# Patient Record
Sex: Male | Born: 1975 | Race: Black or African American | Hispanic: No | Marital: Single | Smoking: Never smoker
Health system: Southern US, Community
[De-identification: ages and names within clinical notes are randomized; demographics above are authoritative.]

## PROBLEM LIST (undated history)

## (undated) DIAGNOSIS — T7840XA Allergy, unspecified, initial encounter: Secondary | ICD-10-CM

## (undated) HISTORY — PX: TOTAL HIP ARTHROPLASTY: SHX124

## (undated) HISTORY — DX: Allergy, unspecified, initial encounter: T78.40XA

---

## 2014-06-12 ENCOUNTER — Telehealth: Payer: Self-pay | Admitting: Oncology

## 2014-06-12 NOTE — Telephone Encounter (Signed)
new patient appt-s/w patient and gave np appt for 06/02 @ 10:30 w/Dr. Clelia CroftShadad.  Referring Dr. Leodis SiasFrancis Wong  Dx-Lymphoadenpathy

## 2014-06-28 ENCOUNTER — Telehealth: Payer: Self-pay | Admitting: *Deleted

## 2014-06-28 NOTE — Telephone Encounter (Signed)
This RN called and left a message on patient's cell phone regarding date and time of his appointments on June, 2nd. Instructed to call [phone] if he had any questions.

## 2014-06-29 ENCOUNTER — Ambulatory Visit: Payer: Managed Care, Other (non HMO)

## 2014-06-29 ENCOUNTER — Encounter (INDEPENDENT_AMBULATORY_CARE_PROVIDER_SITE_OTHER): Payer: Self-pay

## 2014-06-29 ENCOUNTER — Telehealth: Payer: Self-pay | Admitting: Oncology

## 2014-06-29 ENCOUNTER — Ambulatory Visit (HOSPITAL_BASED_OUTPATIENT_CLINIC_OR_DEPARTMENT_OTHER): Payer: Managed Care, Other (non HMO) | Admitting: Oncology

## 2014-06-29 ENCOUNTER — Other Ambulatory Visit: Payer: Self-pay

## 2014-06-29 ENCOUNTER — Encounter: Payer: Self-pay | Admitting: Oncology

## 2014-06-29 VITALS — BP 125/80 | HR 68 | Temp 98.7°F | Resp 18 | Ht 70.0 in | Wt 218.8 lb

## 2014-06-29 DIAGNOSIS — H938X1 Other specified disorders of right ear: Secondary | ICD-10-CM

## 2014-06-29 NOTE — Progress Notes (Signed)
Please see consult note.  

## 2014-06-29 NOTE — Consult Note (Signed)
Reason for Referral: Periauricular mass.   HPI: [name] is a 39 year old gentleman without really any significant past medical history. He does have seasonal allergy but for the most part rather healthy. He did have traumatic injury and required surgery as a child but for the most part rather healthy and only takes allergy medication. 9 years ago he noted a lump on his right side of the neck that was lanced surgically and was told that it was an abscess. 6 months ago, he noted a small growth behind his right ear but has not changed over the period of time. He feels is similar in appearance to what he had 9 years ago. He has not reported any lymphadenopathy. Does not report any neck masses or any other lumps anywhere. He does not report any constitutional symptoms of fevers or chills or sweats. He does not report any change in his appetite or performance status. He does not report any pruritus or easy bruisability.  He does not report any headaches, blurry vision, syncope or seizures. He does not report any fevers, chills, sweats or weight loss. He does not report any chest pain, palpitation orthopnea. He does not report any cough or hemoptysis or hematemesis. Report any nausea or vomiting or abdominal pain. Cipro any hematochezia or melanoma. Remaining review of systems unremarkable.   Past Medical History  Diagnosis Date  . Allergy   :  No past surgical history on file.:   Current outpatient prescriptions:  .  loratadine (CLARITIN) 10 MG tablet, Take 10 mg by mouth daily., Disp: , Rfl: :  No Known Allergies:  No family history on file.:  History   Social History  . Marital Status: Single    Spouse Name: N/A  . Number of Children: N/A  . Years of Education: N/A   Occupational History  . Not on file.   Social History Main Topics  . Smoking status: Not on file  . Smokeless tobacco: Not on file  . Alcohol Use: Not on file  . Drug Use: Not on file  . Sexual Activity: Not on file    Other Topics Concern  . Not on file   Social History Narrative  . No narrative on file  :  Pertinent items are noted in HPI.  Exam: ECOG 0 Blood pressure 125/80, pulse 68, temperature 98.7 F (37.1 C), temperature source Oral, resp. rate 18, height 5\' 10"  (1.778 m), weight 218 lb 12.8 oz (99.247 kg). General appearance: alert and cooperative Head: Normocephalic, without obvious abnormality, atraumatic Ears: normal TM's and external ear canals both ears and A 10 mm nodule noted in the periauricular area on the right side. Throat: lips, mucosa, and tongue normal; teeth and gums normal Neck: no adenopathy and no carotid bruit Back: negative, symmetric, no curvature. ROM normal. No CVA tenderness. Resp: clear to auscultation bilaterally Chest wall: no tenderness Cardio: regular rate and rhythm, S1, S2 normal, no murmur, click, rub or gallop GI: soft, non-tender; bowel sounds normal; no masses,  no organomegaly Extremities: extremities normal, atraumatic, no cyanosis or edema Pulses: 2+ and symmetric Lymph nodes: Cervical, supraclavicular, and axillary nodes normal.   Assessment and Plan:   39 year old gentleman with right-sided periauricular growth measuring around 10 mm. Soft touch without any erythema or induration. I cannot feel any lymphadenopathy in the cervical area or any other area in his body. His laboratory testing for the last year was reviewed today and shows no abnormalities. An official diagnosis was discussed today with the  patient. This could be a benign growth versus an abscess and less likely malignancy. Lymphoma also is on the differential given his Declercq age and presentation.  I will refer him to [facility] for surgical removal of a growth and pathology review. I will defer any imaging studies unless the nature of his growth is malignant.  All his questions are answered today to his satisfaction.

## 2014-06-29 NOTE — Telephone Encounter (Signed)
Gave and prnted appt sched and avs fo rpt for June and July.....Dr. Pollyann Kennedyosen 6.3 @ 1pm

## 2014-06-29 NOTE — Progress Notes (Signed)
Checked in new pt with no financial concerns. °

## 2014-08-23 ENCOUNTER — Ambulatory Visit: Payer: Self-pay | Admitting: Oncology

## 2015-12-06 ENCOUNTER — Telehealth: Payer: Self-pay | Admitting: *Deleted

## 2015-12-06 NOTE — Telephone Encounter (Signed)
Patient called and stated,"I saw Dr. Clelia CroftShadad June of 2016, and I have decided to go ahead and I need to go ahead and schedule surgery. Do I need a follow up with Dr. Clelia CroftShadad before surgery?" Per Dr. Clelia CroftShadad, [facility] and that is who he needs to call. After surgery, if he needs to be referred back to me, then we can do a follow up. This message was left on patient's cell phone. Instructed patient to call [phone] if he had any questions or concerns.

## 2018-01-01 MED ORDER — LACTATED RINGERS IV SOLN
INTRAVENOUS | Status: DC
Start: ? — End: 2018-01-01

## 2018-01-01 MED ORDER — BISACODYL 10 MG RE SUPP
10.00 | RECTAL | Status: DC
Start: ? — End: 2018-01-01

## 2018-01-01 MED ORDER — OXYCODONE HCL 5 MG PO TABS
5.00 | ORAL_TABLET | ORAL | Status: DC
Start: ? — End: 2018-01-01

## 2018-01-01 MED ORDER — CELECOXIB 200 MG PO CAPS
200.00 | ORAL_CAPSULE | ORAL | Status: DC
Start: 2018-01-02 — End: 2018-01-01

## 2018-01-01 MED ORDER — GABAPENTIN 300 MG PO CAPS
300.00 | ORAL_CAPSULE | ORAL | Status: DC
Start: 2018-01-01 — End: 2018-01-01

## 2018-01-01 MED ORDER — NALOXONE HCL 0.4 MG/ML IJ SOLN
.10 | INTRAMUSCULAR | Status: DC
Start: ? — End: 2018-01-01

## 2018-01-01 MED ORDER — ASPIRIN EC 81 MG PO TBEC
81.00 | DELAYED_RELEASE_TABLET | ORAL | Status: DC
Start: 2018-01-01 — End: 2018-01-01

## 2018-01-01 MED ORDER — SODIUM CHLORIDE FLUSH 0.9 % IV SOLN
10.00 | INTRAVENOUS | Status: DC
Start: 2018-01-01 — End: 2018-01-01

## 2018-01-01 MED ORDER — ACETAMINOPHEN 500 MG PO TABS
1000.00 | ORAL_TABLET | ORAL | Status: DC
Start: 2018-01-01 — End: 2018-01-01

## 2018-01-01 MED ORDER — ONDANSETRON HCL 4 MG/2ML IJ SOLN
4.00 | INTRAMUSCULAR | Status: DC
Start: ? — End: 2018-01-01

## 2018-01-01 MED ORDER — NALOXONE HCL 0.4 MG/ML IJ SOLN
.40 | INTRAMUSCULAR | Status: DC
Start: ? — End: 2018-01-01

## 2018-01-01 MED ORDER — CETIRIZINE HCL 10 MG PO TABS
10.00 | ORAL_TABLET | ORAL | Status: DC
Start: 2018-01-02 — End: 2018-01-01

## 2018-01-01 MED ORDER — ALUMINUM-MAGNESIUM-SIMETHICONE 200-200-20 MG/5ML PO SUSP
30.00 | ORAL | Status: DC
Start: ? — End: 2018-01-01

## 2018-01-01 MED ORDER — ENEMA 7-19 GM/118ML RE ENEM
1.00 | ENEMA | RECTAL | Status: DC
Start: ? — End: 2018-01-01

## 2018-01-01 MED ORDER — OXYCODONE HCL 5 MG PO TABS
10.00 | ORAL_TABLET | ORAL | Status: DC
Start: ? — End: 2018-01-01

## 2018-01-01 MED ORDER — SODIUM CHLORIDE FLUSH 0.9 % IV SOLN
10.00 | INTRAVENOUS | Status: DC
Start: ? — End: 2018-01-01

## 2018-01-01 MED ORDER — SENNOSIDES-DOCUSATE SODIUM 8.6-50 MG PO TABS
2.00 | ORAL_TABLET | ORAL | Status: DC
Start: 2018-01-01 — End: 2018-01-01

## 2018-09-09 ENCOUNTER — Ambulatory Visit (INDEPENDENT_AMBULATORY_CARE_PROVIDER_SITE_OTHER): Payer: Managed Care, Other (non HMO) | Admitting: Neurology

## 2018-09-09 ENCOUNTER — Telehealth: Payer: Self-pay | Admitting: Neurology

## 2018-09-09 ENCOUNTER — Other Ambulatory Visit: Payer: Self-pay

## 2018-09-09 ENCOUNTER — Encounter: Payer: Self-pay | Admitting: Neurology

## 2018-09-09 VITALS — BP 126/72 | HR 82 | Temp 98.0°F | Ht 70.5 in | Wt 218.0 lb

## 2018-09-09 DIAGNOSIS — R350 Frequency of micturition: Secondary | ICD-10-CM

## 2018-09-09 DIAGNOSIS — F0781 Postconcussional syndrome: Secondary | ICD-10-CM | POA: Diagnosis not present

## 2018-09-09 DIAGNOSIS — G4489 Other headache syndrome: Secondary | ICD-10-CM

## 2018-09-09 DIAGNOSIS — R55 Syncope and collapse: Secondary | ICD-10-CM | POA: Diagnosis not present

## 2018-09-09 MED ORDER — IMIPRAMINE HCL 25 MG PO TABS: 25.0000 mg | ORAL_TABLET | Freq: Every day | ORAL | 3 refills | Status: DC

## 2018-09-09 MED ORDER — ETODOLAC 400 MG PO TABS: 400.0000 mg | ORAL_TABLET | Freq: Two times a day (BID) | ORAL | 5 refills | Status: DC

## 2018-09-09 NOTE — Telephone Encounter (Signed)
[employer] order sent to [facility]. They will obtain the auth and reach out to the patient to schedule.  

## 2018-09-09 NOTE — Progress Notes (Signed)
[facility]  PATIENT: Raymond Washington DOB: 10/14/1975  REFERRING DOCTOR OR PCP:  Leodis SiasFrancis Wong SOURCE: Patient, notes from Dr. Modesto CharonWong.  _________________________________   HISTORICAL  CHIEF COMPLAINT:  Chief Complaint  Patient presents with  . New Patient (Initial Visit)    RM 12, alone. Paper referral from Dr. Modesto CharonWong for syncope, headaches  . Headaches/syncope    Started having daily headaches about 3 weeks ago. Has progressed to migraines in the last three days. PCP initially treated him for what they thought was sinus issues but this was ineffective per pt.    HISTORY OF PRESENT ILLNESS:  I had the pleasure seeing your patient, Raymond Washington, [facility] for neurologic consultation regarding his headaches and episode of syncope.  He is a 43 year old man who had an episode of sinus type symptoms and headaches.   He had spoken with [facility] and was prescribed Flonase.  He took that with Sudafed.   He felt more headache and pressure in his head.     He contacted [facility] again and a Covid-19 test was recommended (13 days ago).   His test was negative.    Right before the test, he took a shower.   He felt lightheaded and lost consciousness.   He hit his head on his way down.   Loss of consciousness for just for seconds and he felt he quickly returned toward baseline.  However, since then, he has had more headache and the characteristic has changed.    Pain is in the right side and involves the back of the neck up to the periorbital area.   His current headache is pressure like and neck is very uncomfortable.  He has some photophobia and photophobia.  He had the nausea the first 3 days after the fall/injury but not now.   Moving his head does not affect the pain. Rest improves the pain.  Aleve or ibuprofen help a few hours and then pain returns to the same level.    He denies any more lightheadedness and has had no mor episodes of syncope     He denies any  change in vision, strength, sensation or coordination.      He does not have many headaches but notes some associated with right eye drooping associated with going to a bar and having a few drinks.    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   He notes urinary frequency since.      REVIEW OF SYSTEMS: Constitutional: No fevers, chills, sweats, or change in appetite Eyes: No visual changes, double vision, eye pain Ear, nose and throat: No hearing loss, ear pain, nasal congestion, sore throat Cardiovascular: No chest pain, palpitations Respiratory: No shortness of breath at rest or with exertion.   No wheezes GastrointestinaI: No nausea, vomiting, diarrhea, abdominal pain, fecal incontinence Genitourinary: No dysuria, urinary retention or frequency.  No nocturia. Musculoskeletal: No neck pain, back pain Integumentary: No rash, pruritus, skin lesions Neurological: as above Psychiatric: No depression at this time.  No anxiety Endocrine: No palpitations, diaphoresis, change in appetite, change in weigh or increased thirst Hematologic/Lymphatic: No anemia, purpura, petechiae. Allergic/Immunologic: No itchy/runny eyes, nasal congestion, recent allergic reactions, rashes  ALLERGIES: No Known Allergies  HOME MEDICATIONS:  Current Outpatient Medications:  .  fexofenadine (ALLEGRA) 180 MG tablet, Take 180 mg by mouth daily., Disp: , Rfl:  .  etodolac (LODINE) 400 MG tablet, Take 1 tablet (400  mg total) by mouth 2 (two) times daily., Disp: 60 tablet, Rfl: 5 .  imipramine (TOFRANIL) 25 MG tablet, Take 1 tablet (25 mg total) by mouth at bedtime., Disp: 30 tablet, Rfl: 3  PAST MEDICAL HISTORY: Past Medical History:  Diagnosis Date  . Allergy     PAST SURGICAL HISTORY: Past Surgical History:  Procedure Laterality Date  . TOTAL HIP ARTHROPLASTY Left     FAMILY HISTORY: History reviewed. No pertinent family history.  SOCIAL HISTORY:   Social History   Socioeconomic History  . Marital status: Single    Spouse name: Not on file  . Number of children: Not on file  . Years of education: Not on file  . Highest education level: Not on file  Occupational History  . Occupation: [employer]  . Financial resource strain: Not on file  . Food insecurity    Worry: Not on file    Inability: Not on file  . Transportation needs    Medical: Not on file    Non-medical: Not on file  Tobacco Use  . Smoking status: Never Smoker  . Smokeless tobacco: Never Used  Substance and Sexual Activity  . Alcohol use: Yes    Comment: socially  . Drug use: Never  . Sexual activity: Not on file  Lifestyle  . Physical activity    Days per week: Not on file    Minutes per session: Not on file  . Stress: Not on file  Relationships  . Social Herbalist on phone: Not on file    Gets together: Not on file    Attends religious service: Not on file    Active member of club or organization: Not on file    Attends meetings of clubs or organizations: Not on file    Relationship status: Not on file  . Intimate partner violence    Fear of current or ex partner: Not on file    Emotionally abused: Not on file    Physically abused: Not on file    Forced sexual activity: Not on file  Other Topics Concern  . Not on file  Social History Narrative   Right handed    Tea every other day     PHYSICAL EXAM  Vitals:   09/09/18 1302  BP: 126/72  Pulse: 82  Temp: 98 F (36.7 C)  Weight: 218 lb (98.9 kg)  Height: 5' 10.5" (1.791 m)    Body mass index is 30.84 kg/m.   General: The patient is well-developed and well-nourished and in no acute distress  HEENT:  Head is [place]/AT.  Sclera are anicteric.  Funduscopic exam shows normal optic discs and retinal vessels.  Neck: No carotid bruits are noted.  The neck is minimally tender over the occiput..  Cardiovascular: The heart has a regular rate and  rhythm with a normal S1 and S2. There were no murmurs, gallops or rubs.    Skin: Extremities are without rash or  edema.  Neurologic Exam  Mental status: The patient is alert and oriented x 3 at the time of the examination. The patient has apparent normal recent and remote memory, with an apparently normal attention span and concentration ability.   Speech is normal.  Cranial nerves: Extraocular movements are full. Pupils are equal, round, and reactive to light and accomodation.  Visual fields are full.  Facial symmetry is present. There is good facial sensation to soft touch bilaterally.Facial strength is normal.  Trapezius and sternocleidomastoid strength is normal. No dysarthria is noted.  The tongue is midline, and the patient has symmetric elevation of the soft palate. No obvious hearing deficits are noted.  Motor:  Muscle bulk is normal.   Tone is normal. Strength is  5 / 5 in all 4 extremities.   Sensory: Sensory testing is intact to pinprick, soft touch and vibration sensation in all 4 extremities.  Coordination: Cerebellar testing reveals good finger-nose-finger and heel-to-shin bilaterally.  Gait and station: Station is normal.   Gait is normal. Tandem gait is normal. Romberg is negative.   Reflexes: Deep tendon reflexes are symmetric and normal bilaterally.   Plantar responses are flexor.      ASSESSMENT AND PLAN  1. Post concussion syndrome   2. Other headache syndrome   3. Urinary frequency   4. Syncope, unspecified syncope type     In summary, Raymond Washington is a 43 year old man who had a single episode of syncope hitting the back of his head on his way down.  Since then, he has had a constant headache with some fluctuations.  Additionally for several days he felt cognitively foggy and had dizziness.  Those symptoms are much better and almost completely resolved.  The symptoms are most consistent with postconcussive syndrome with headaches.  He did note an increase in his  urinary frequency since the fall.  There is no evidence of myelopathy on exam.  We will check an MRI of the brain to determine if there or any intracranial hematomas or other issue that might explain his symptoms.  The etiology of the syncope is unclear but was preceded by lightheadedness.  This could have been a vasovagal response to the hot shower and possible viral syndrome.  He has not had more episodes of lightheadedness and the characteristics would not be typical for seizure so EEG is not needed at this point  To help with the pain he will start etodolac 400 g p.o. twice a day and imipramine 25 mg nightly.  Besides helping postconcussive headache, imipramine may also help the urinary frequency at night.  He will return to see us in 6 weeks or sooner if there are new or worsening neurologic symptoms.  Thank you for asking me to see Mr. Noell.  Please let me know if I can be of further assistance with him or other patients in the future.    Richard A. Epimenio FootSater, MD, PhD, Larene BeachFAAN 09/09/2018, 6:02 PM Certified in Neurology, Clinical Neurophysiology, Sleep Medicine and Neuroimaging  [facility] [address], [address] [place], [place] [id number] [phone]

## 2018-09-13 ENCOUNTER — Other Ambulatory Visit: Payer: Managed Care, Other (non HMO)

## 2018-09-14 NOTE — Telephone Encounter (Signed)
Faxed to [employer] health.

## 2018-09-14 NOTE — Telephone Encounter (Signed)
Novella Rob: [id number] (exp. 09/10/18 to 03/09/19) patient is scheduled at GI for 10/12/18.

## 2018-09-14 NOTE — Telephone Encounter (Signed)
Scheduled at [place] [facility] for 09/16/18.

## 2018-09-16 ENCOUNTER — Telehealth: Payer: Self-pay | Admitting: Neurology

## 2018-09-16 NOTE — Telephone Encounter (Signed)
I called pt,his scan was done @ [facility]. I told pt that we are able to see his scan. Pt will not be dropping off his cd.

## 2018-09-16 NOTE — Telephone Encounter (Signed)
Pt said he just had his MRI done and he was given a CD, pt asking if he needs to drop this off to Dr Felecia Shelling, please call

## 2018-09-16 NOTE — Telephone Encounter (Signed)
Raymond Washington- can you call pt back. Appears he had MRI at [facility]. If he did, we should be able to see MRI images without him needing to drop off CD. Can you confirm with him?

## 2018-09-22 ENCOUNTER — Telehealth: Payer: Self-pay | Admitting: Neurology

## 2018-09-22 NOTE — Telephone Encounter (Signed)
Spoke to the patient.  He has been notified of his results and verbalized understanding.

## 2018-09-22 NOTE — Telephone Encounter (Signed)
Please let the patient know that the MRI is normal

## 2018-09-22 NOTE — Telephone Encounter (Signed)
Pt is asking for a call back with results to MRI that was done at [facility] on 08-20

## 2018-10-02 ENCOUNTER — Other Ambulatory Visit: Payer: Self-pay | Admitting: Neurology

## 2018-10-12 ENCOUNTER — Other Ambulatory Visit: Payer: Managed Care, Other (non HMO)

## 2018-11-02 ENCOUNTER — Encounter: Payer: Self-pay | Admitting: Neurology

## 2018-11-02 ENCOUNTER — Other Ambulatory Visit: Payer: Self-pay

## 2018-11-02 ENCOUNTER — Ambulatory Visit (INDEPENDENT_AMBULATORY_CARE_PROVIDER_SITE_OTHER): Payer: Managed Care, Other (non HMO) | Admitting: Neurology

## 2018-11-02 VITALS — BP 128/73 | HR 74 | Temp 97.8°F | Ht 70.5 in | Wt 219.8 lb

## 2018-11-02 DIAGNOSIS — R55 Syncope and collapse: Secondary | ICD-10-CM | POA: Diagnosis not present

## 2018-11-02 DIAGNOSIS — G4489 Other headache syndrome: Secondary | ICD-10-CM

## 2018-11-02 DIAGNOSIS — F0781 Postconcussional syndrome: Secondary | ICD-10-CM

## 2018-11-02 NOTE — Progress Notes (Signed)
[facility]  PATIENT: Raymond Washington DOB: 04-09-1975  REFERRING DOCTOR OR PCP:  Leodis SiasFrancis Wong SOURCE: Patient, notes from Dr. Modesto CharonWong.  _________________________________   HISTORICAL  CHIEF COMPLAINT:  Chief Complaint  Patient presents with  . Follow-up    RM 12, alone. Last seen 09/09/2018. Has not had headache in the last few weeks.    HISTORY OF PRESENT ILLNESS:  Update 11/02/2018: He had syncope in the shower and hit his head with subsequent post traumatic headaches.  At the last visit, he was placed on imipramine and etodolac.   He has not had any headache the last 2-3 weeks.   Headaches improved and he stopped the medications.   He was having erythema of the right eye but that has resoled as well.    No more episodes of syncope or lightheadedness   From 09/09/2018: I had the [facility] seeing your patient, Raymond Washington, [facility] for neurologic consultation regarding his headaches and episode of syncope.  He is a 43 year old man who had an episode of sinus type symptoms and headaches.   He had spoken with [facility] and was prescribed Flonase.  He took that with Sudafed.   He felt more headache and pressure in his head.     He contacted [facility] again and a Covid-19 test was recommended (13 days ago).   His test was negative.    Right before the test, he took a shower.   He felt lightheaded and lost consciousness.   He hit his head on his way down.   Loss of consciousness for just for seconds and he felt he quickly returned toward baseline.  However, since then, he has had more headache and the characteristic has changed.    Pain is in the right side and involves the back of the neck up to the periorbital area.   His current headache is pressure like and neck is very uncomfortable.  He has some photophobia and photophobia.  He had the nausea the first 3 days after the fall/injury but not now.   Moving his head does not affect the pain. Rest  improves the pain.  Aleve or ibuprofen help a few hours and then pain returns to the same level.    He denies any more lightheadedness and has had no mor episodes of syncope     He denies any change in vision, strength, sensation or coordination.      He does not have many headaches but notes some associated with right eye drooping associated with going to a bar and having a few drinks.    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   He notes urinary frequency since.      REVIEW OF SYSTEMS: Constitutional: No fevers, chills, sweats, or change in appetite Eyes: No visual changes, double vision, eye pain Ear, nose and throat: No hearing loss, ear pain, nasal congestion, sore throat Cardiovascular: No chest pain, palpitations Respiratory: No shortness of breath at rest or with exertion.   No wheezes GastrointestinaI: No nausea, vomiting, diarrhea, abdominal pain, fecal incontinence Genitourinary: No dysuria, urinary retention or frequency.  No nocturia. Musculoskeletal: No neck pain, back pain Integumentary: No rash, pruritus, skin lesions Neurological: as above Psychiatric: No depression at this time.  No anxiety Endocrine: No palpitations, diaphoresis, change in appetite, change in weigh or increased thirst Hematologic/Lymphatic: No anemia, purpura, petechiae. Allergic/Immunologic: No itchy/runny eyes, nasal congestion, recent allergic reactions, rashes  ALLERGIES: No Known Allergies  HOME MEDICATIONS:  Current Outpatient Medications:  .  fexofenadine (ALLEGRA) 180 MG tablet, Take 180 mg by mouth daily., Disp: , Rfl:   PAST MEDICAL HISTORY: Past Medical History:  Diagnosis Date  . Allergy     PAST SURGICAL HISTORY: Past Surgical History:  Procedure Laterality Date  . TOTAL HIP ARTHROPLASTY Left     FAMILY HISTORY: No family history on file.  SOCIAL HISTORY:  Social History   Socioeconomic History  . Marital status:  Single    Spouse name: Not on file  . Number of children: Not on file  . Years of education: Not on file  . Highest education level: Not on file  Occupational History  . Occupation: [employer] Washington [employer]  . Financial resource strain: Not on file  . Food insecurity    Worry: Not on file    Inability: Not on file  . Transportation needs    Medical: Not on file    Non-medical: Not on file  Tobacco Use  . Smoking status: Never Smoker  . Smokeless tobacco: Never Used  Substance and Sexual Activity  . Alcohol use: Yes    Comment: socially  . Drug use: Never  . Sexual activity: Not on file  Lifestyle  . Physical activity    Days per week: Not on file    Minutes per session: Not on file  . Stress: Not on file  Relationships  . Social Herbalist on phone: Not on file    Gets together: Not on file    Attends religious service: Not on file    Active member of club or organization: Not on file    Attends meetings of clubs or organizations: Not on file    Relationship status: Not on file  . Intimate partner violence    Fear of current or ex partner: Not on file    Emotionally abused: Not on file    Physically abused: Not on file    Forced sexual activity: Not on file  Other Topics Concern  . Not on file  Social History Narrative   Right handed    Tea every other day     PHYSICAL EXAM  Vitals:   11/02/18 1512  BP: 128/73  Pulse: 74  Temp: 97.8 F (36.6 C)  Weight: 219 lb 12.8 oz (99.7 kg)  Height: 5' 10.5" (1.791 m)    Body mass index is 31.09 kg/m.   General: The patient is well-developed and well-nourished and in no acute distress   Neck: Nontender over the occiput.Kermit Balo rom   Neurologic Exam  Mental status: The patient is alert and oriented x 3 at the time of the examination. The patient has apparent normal recent and remote memory, with an apparently normal attention span and concentration ability.   Speech is normal.   Cranial nerves: Extraocular movements are full.  .Facial strength is normal.  Trapezius and sternocleidomastoid strength is normal. No dysarthria is noted.   No obvious hearing deficits are noted.  Motor:  Muscle bulk is normal.   Tone is normal. Strength is  5 / 5 in all 4 extremities.   Coordination: Cerebellar testing reveals good finger-nose-finger and heel-to-shin bilaterally.  Gait and station: Station is normal.   Gait is normal. Tandem gait is normal. Romberg is negative.        ASSESSMENT AND PLAN  1. Syncope, unspecified syncope type   2. Post  concussion syndrome   3. Other headache syndrome     1.   He is doing very well with no symptoms.  He stopped the imipramine and etodolac a couple weeks ago and headaches continued to do well. 2.   He will stay off medications and call if he has any new or worsening neurologic symptoms.  He will return as needed.  Carmie Lanpher A. Epimenio Foot, MD, PhD, Larene Beach 11/02/2018, 3:45 PM Certified in Neurology, Clinical Neurophysiology, Sleep Medicine and Neuroimaging  [facility] [address] [place], [place] [address] [phone]

## 2018-12-06 ENCOUNTER — Emergency Department (HOSPITAL_BASED_OUTPATIENT_CLINIC_OR_DEPARTMENT_OTHER)
Admission: EM | Admit: 2018-12-06 | Discharge: 2018-12-06 | Disposition: A | Discharge status: Home / Self Care | Payer: Managed Care, Other (non HMO) | Attending: Emergency Medicine | Admitting: Emergency Medicine

## 2018-12-06 ENCOUNTER — Encounter (HOSPITAL_BASED_OUTPATIENT_CLINIC_OR_DEPARTMENT_OTHER): Payer: Self-pay | Admitting: *Deleted

## 2018-12-06 ENCOUNTER — Other Ambulatory Visit: Payer: Self-pay

## 2018-12-06 DIAGNOSIS — Z79899 Other long term (current) drug therapy: Secondary | ICD-10-CM | POA: Diagnosis not present

## 2018-12-06 DIAGNOSIS — W260XXA Contact with knife, initial encounter: Secondary | ICD-10-CM | POA: Insufficient documentation

## 2018-12-06 DIAGNOSIS — Y9389 Activity, other specified: Secondary | ICD-10-CM | POA: Diagnosis not present

## 2018-12-06 DIAGNOSIS — S61011A Laceration without foreign body of right thumb without damage to nail, initial encounter: Secondary | ICD-10-CM | POA: Diagnosis not present

## 2018-12-06 DIAGNOSIS — Y999 Unspecified external cause status: Secondary | ICD-10-CM | POA: Diagnosis not present

## 2018-12-06 DIAGNOSIS — Y929 Unspecified place or not applicable: Secondary | ICD-10-CM | POA: Diagnosis not present

## 2018-12-06 DIAGNOSIS — Z96642 Presence of left artificial hip joint: Secondary | ICD-10-CM | POA: Insufficient documentation

## 2018-12-06 DIAGNOSIS — S6991XA Unspecified injury of right wrist, hand and finger(s), initial encounter: Secondary | ICD-10-CM | POA: Diagnosis present

## 2018-12-06 MED ORDER — LIDOCAINE HCL 2 % IJ SOLN
20.0000 mL | Freq: Once | INTRAMUSCULAR | Status: DC
  Filled 2018-12-06: qty 20

## 2018-12-06 NOTE — Discharge Instructions (Addendum)
You were seen today for a laceration of the thumb.  Keep covered.  Apply bacitracin ointment.  Suture removal in 10 days.  You may keep it protected with a splint.  Do not submerge in water.

## 2018-12-06 NOTE — ED Notes (Signed)
EDP at bedside  
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
## 2018-12-07 ENCOUNTER — Other Ambulatory Visit: Payer: Self-pay

## 2018-12-07 ENCOUNTER — Emergency Department (HOSPITAL_BASED_OUTPATIENT_CLINIC_OR_DEPARTMENT_OTHER)
Admission: EM | Admit: 2018-12-07 | Discharge: 2018-12-08 | Disposition: A | Discharge status: Home / Self Care | Payer: Managed Care, Other (non HMO) | Attending: Emergency Medicine | Admitting: Emergency Medicine

## 2018-12-07 ENCOUNTER — Encounter (HOSPITAL_BASED_OUTPATIENT_CLINIC_OR_DEPARTMENT_OTHER): Payer: Self-pay | Admitting: *Deleted

## 2018-12-07 DIAGNOSIS — S61011D Laceration without foreign body of right thumb without damage to nail, subsequent encounter: Secondary | ICD-10-CM | POA: Diagnosis present

## 2018-12-07 DIAGNOSIS — Z79899 Other long term (current) drug therapy: Secondary | ICD-10-CM | POA: Diagnosis not present

## 2018-12-07 DIAGNOSIS — W260XXD Contact with knife, subsequent encounter: Secondary | ICD-10-CM | POA: Insufficient documentation

## 2018-12-07 DIAGNOSIS — Z5189 Encounter for other specified aftercare: Secondary | ICD-10-CM

## 2018-12-07 NOTE — ED Triage Notes (Signed)
Pt c/o wound recheck , drg stuck to wound , sutures intact swelling noted

## 2018-12-08 NOTE — ED Notes (Signed)
Xeroform applied with top drg and coband, verbal understanding
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
## 2019-10-10 DIAGNOSIS — R7303 Prediabetes: Secondary | ICD-10-CM | POA: Diagnosis not present

## 2019-10-10 DIAGNOSIS — R5383 Other fatigue: Secondary | ICD-10-CM | POA: Diagnosis not present

## 2019-10-10 DIAGNOSIS — E782 Mixed hyperlipidemia: Secondary | ICD-10-CM | POA: Diagnosis not present

## 2019-10-10 DIAGNOSIS — Z0000 Encounter for general adult medical examination without abnormal findings: Secondary | ICD-10-CM | POA: Diagnosis not present

## 2019-10-10 DIAGNOSIS — R972 Elevated prostate specific antigen [PSA]: Secondary | ICD-10-CM | POA: Diagnosis not present

## 2019-10-10 DIAGNOSIS — Z113 Encounter for screening for infections with a predominantly sexual mode of transmission: Secondary | ICD-10-CM | POA: Diagnosis not present

## 2019-10-20 DIAGNOSIS — Z1211 Encounter for screening for malignant neoplasm of colon: Secondary | ICD-10-CM | POA: Diagnosis not present

## 2019-11-01 DIAGNOSIS — Z23 Encounter for immunization: Secondary | ICD-10-CM | POA: Diagnosis not present

## 2020-01-09 DIAGNOSIS — Z113 Encounter for screening for infections with a predominantly sexual mode of transmission: Secondary | ICD-10-CM | POA: Diagnosis not present

## 2020-01-09 DIAGNOSIS — E782 Mixed hyperlipidemia: Secondary | ICD-10-CM | POA: Diagnosis not present

## 2020-01-09 DIAGNOSIS — M2141 Flat foot [pes planus] (acquired), right foot: Secondary | ICD-10-CM | POA: Diagnosis not present

## 2020-01-09 DIAGNOSIS — R7303 Prediabetes: Secondary | ICD-10-CM | POA: Diagnosis not present

## 2020-01-18 DIAGNOSIS — Z96642 Presence of left artificial hip joint: Secondary | ICD-10-CM | POA: Diagnosis not present

## 2020-01-18 DIAGNOSIS — M1631 Unilateral osteoarthritis resulting from hip dysplasia, right hip: Secondary | ICD-10-CM | POA: Diagnosis not present

## 2020-01-18 DIAGNOSIS — M1611 Unilateral primary osteoarthritis, right hip: Secondary | ICD-10-CM | POA: Diagnosis not present

## 2020-01-18 DIAGNOSIS — Z471 Aftercare following joint replacement surgery: Secondary | ICD-10-CM | POA: Diagnosis not present

## 2020-01-18 DIAGNOSIS — Q6589 Other specified congenital deformities of hip: Secondary | ICD-10-CM | POA: Diagnosis not present

## 2020-05-14 DIAGNOSIS — M2142 Flat foot [pes planus] (acquired), left foot: Secondary | ICD-10-CM | POA: Diagnosis not present

## 2020-05-14 DIAGNOSIS — E782 Mixed hyperlipidemia: Secondary | ICD-10-CM | POA: Diagnosis not present

## 2020-05-14 DIAGNOSIS — R3581 Nocturnal polyuria: Secondary | ICD-10-CM | POA: Diagnosis not present

## 2020-05-14 DIAGNOSIS — M2141 Flat foot [pes planus] (acquired), right foot: Secondary | ICD-10-CM | POA: Diagnosis not present

## 2020-05-14 DIAGNOSIS — R7303 Prediabetes: Secondary | ICD-10-CM | POA: Diagnosis not present

## 2020-07-08 DIAGNOSIS — L209 Atopic dermatitis, unspecified: Secondary | ICD-10-CM | POA: Diagnosis not present

## 2020-09-01 DIAGNOSIS — Z20822 Contact with and (suspected) exposure to covid-19: Secondary | ICD-10-CM | POA: Diagnosis not present

## 2020-10-15 DIAGNOSIS — Z125 Encounter for screening for malignant neoplasm of prostate: Secondary | ICD-10-CM | POA: Diagnosis not present

## 2020-10-15 DIAGNOSIS — R3581 Nocturnal polyuria: Secondary | ICD-10-CM | POA: Diagnosis not present

## 2020-10-15 DIAGNOSIS — Z113 Encounter for screening for infections with a predominantly sexual mode of transmission: Secondary | ICD-10-CM | POA: Diagnosis not present

## 2020-10-15 DIAGNOSIS — R7303 Prediabetes: Secondary | ICD-10-CM | POA: Diagnosis not present

## 2020-10-15 DIAGNOSIS — R748 Abnormal levels of other serum enzymes: Secondary | ICD-10-CM | POA: Diagnosis not present

## 2020-10-15 DIAGNOSIS — E782 Mixed hyperlipidemia: Secondary | ICD-10-CM | POA: Diagnosis not present

## 2020-10-15 DIAGNOSIS — Z0000 Encounter for general adult medical examination without abnormal findings: Secondary | ICD-10-CM | POA: Diagnosis not present

## 2020-10-16 DIAGNOSIS — R3581 Nocturnal polyuria: Secondary | ICD-10-CM | POA: Diagnosis not present

## 2020-10-16 DIAGNOSIS — Z113 Encounter for screening for infections with a predominantly sexual mode of transmission: Secondary | ICD-10-CM | POA: Diagnosis not present

## 2020-10-30 DIAGNOSIS — Z23 Encounter for immunization: Secondary | ICD-10-CM | POA: Diagnosis not present

## 2021-01-25 DIAGNOSIS — M25561 Pain in right knee: Secondary | ICD-10-CM | POA: Diagnosis not present

## 2021-01-30 DIAGNOSIS — M25561 Pain in right knee: Secondary | ICD-10-CM | POA: Diagnosis not present

## 2021-01-30 DIAGNOSIS — M25562 Pain in left knee: Secondary | ICD-10-CM | POA: Diagnosis not present

## 2021-02-27 DIAGNOSIS — M25562 Pain in left knee: Secondary | ICD-10-CM | POA: Diagnosis not present

## 2021-02-27 DIAGNOSIS — M25561 Pain in right knee: Secondary | ICD-10-CM | POA: Diagnosis not present

## 2021-03-08 DIAGNOSIS — M25561 Pain in right knee: Secondary | ICD-10-CM | POA: Diagnosis not present

## 2021-03-13 DIAGNOSIS — M25562 Pain in left knee: Secondary | ICD-10-CM | POA: Diagnosis not present

## 2021-03-13 DIAGNOSIS — M25561 Pain in right knee: Secondary | ICD-10-CM | POA: Diagnosis not present

## 2021-04-15 DIAGNOSIS — M2142 Flat foot [pes planus] (acquired), left foot: Secondary | ICD-10-CM | POA: Diagnosis not present

## 2021-04-15 DIAGNOSIS — Z113 Encounter for screening for infections with a predominantly sexual mode of transmission: Secondary | ICD-10-CM | POA: Diagnosis not present

## 2021-04-15 DIAGNOSIS — R7303 Prediabetes: Secondary | ICD-10-CM | POA: Diagnosis not present

## 2021-04-15 DIAGNOSIS — R3581 Nocturnal polyuria: Secondary | ICD-10-CM | POA: Diagnosis not present

## 2021-04-15 DIAGNOSIS — M2141 Flat foot [pes planus] (acquired), right foot: Secondary | ICD-10-CM | POA: Diagnosis not present

## 2021-04-15 DIAGNOSIS — E782 Mixed hyperlipidemia: Secondary | ICD-10-CM | POA: Diagnosis not present

## 2021-04-22 ENCOUNTER — Other Ambulatory Visit: Payer: Self-pay | Admitting: Family Medicine

## 2021-04-22 DIAGNOSIS — R748 Abnormal levels of other serum enzymes: Secondary | ICD-10-CM

## 2021-05-01 ENCOUNTER — Ambulatory Visit
Admission: RE | Admit: 2021-05-01 | Discharge: 2021-05-01 | Disposition: A | Discharge status: Home / Self Care | Payer: BC Managed Care – PPO | Source: Ambulatory Visit | Attending: Family Medicine | Admitting: Family Medicine

## 2021-05-01 DIAGNOSIS — R748 Abnormal levels of other serum enzymes: Secondary | ICD-10-CM

## 2021-05-01 DIAGNOSIS — R7989 Other specified abnormal findings of blood chemistry: Secondary | ICD-10-CM | POA: Diagnosis not present

## 2021-09-27 DIAGNOSIS — E782 Mixed hyperlipidemia: Secondary | ICD-10-CM | POA: Diagnosis not present

## 2021-09-27 DIAGNOSIS — Z6830 Body mass index (BMI) 30.0-30.9, adult: Secondary | ICD-10-CM | POA: Diagnosis not present

## 2021-09-27 DIAGNOSIS — R748 Abnormal levels of other serum enzymes: Secondary | ICD-10-CM | POA: Diagnosis not present

## 2021-09-27 DIAGNOSIS — R7303 Prediabetes: Secondary | ICD-10-CM | POA: Diagnosis not present

## 2021-11-13 DIAGNOSIS — L858 Other specified epidermal thickening: Secondary | ICD-10-CM | POA: Diagnosis not present

## 2021-11-13 DIAGNOSIS — L728 Other follicular cysts of the skin and subcutaneous tissue: Secondary | ICD-10-CM | POA: Diagnosis not present

## 2021-12-05 DIAGNOSIS — Z113 Encounter for screening for infections with a predominantly sexual mode of transmission: Secondary | ICD-10-CM | POA: Diagnosis not present

## 2022-07-28 DIAGNOSIS — G8929 Other chronic pain: Secondary | ICD-10-CM | POA: Diagnosis not present

## 2022-07-28 DIAGNOSIS — M1611 Unilateral primary osteoarthritis, right hip: Secondary | ICD-10-CM | POA: Diagnosis not present

## 2022-07-28 DIAGNOSIS — M25551 Pain in right hip: Secondary | ICD-10-CM | POA: Diagnosis not present

## 2022-09-03 DIAGNOSIS — Z202 Contact with and (suspected) exposure to infections with a predominantly sexual mode of transmission: Secondary | ICD-10-CM | POA: Diagnosis not present

## 2022-09-03 DIAGNOSIS — Z23 Encounter for immunization: Secondary | ICD-10-CM | POA: Diagnosis not present

## 2022-09-03 DIAGNOSIS — R7303 Prediabetes: Secondary | ICD-10-CM | POA: Diagnosis not present

## 2022-09-03 DIAGNOSIS — Q6589 Other specified congenital deformities of hip: Secondary | ICD-10-CM | POA: Diagnosis not present

## 2022-09-03 DIAGNOSIS — Z1211 Encounter for screening for malignant neoplasm of colon: Secondary | ICD-10-CM | POA: Diagnosis not present

## 2022-09-03 DIAGNOSIS — E6609 Other obesity due to excess calories: Secondary | ICD-10-CM | POA: Diagnosis not present

## 2022-09-03 DIAGNOSIS — Z131 Encounter for screening for diabetes mellitus: Secondary | ICD-10-CM | POA: Diagnosis not present

## 2022-09-03 DIAGNOSIS — E782 Mixed hyperlipidemia: Secondary | ICD-10-CM | POA: Diagnosis not present

## 2022-09-03 DIAGNOSIS — Z0000 Encounter for general adult medical examination without abnormal findings: Secondary | ICD-10-CM | POA: Diagnosis not present

## 2022-11-06 DIAGNOSIS — Z1211 Encounter for screening for malignant neoplasm of colon: Secondary | ICD-10-CM | POA: Diagnosis not present

## 2022-11-06 DIAGNOSIS — Z01818 Encounter for other preprocedural examination: Secondary | ICD-10-CM | POA: Diagnosis not present

## 2022-11-28 DIAGNOSIS — K5730 Diverticulosis of large intestine without perforation or abscess without bleeding: Secondary | ICD-10-CM | POA: Diagnosis not present

## 2022-11-28 DIAGNOSIS — Z1211 Encounter for screening for malignant neoplasm of colon: Secondary | ICD-10-CM | POA: Diagnosis not present

## 2022-12-16 DIAGNOSIS — M1611 Unilateral primary osteoarthritis, right hip: Secondary | ICD-10-CM | POA: Diagnosis not present

## 2022-12-16 DIAGNOSIS — Z96642 Presence of left artificial hip joint: Secondary | ICD-10-CM | POA: Diagnosis not present

## 2022-12-24 DIAGNOSIS — M1611 Unilateral primary osteoarthritis, right hip: Secondary | ICD-10-CM | POA: Diagnosis not present

## 2022-12-24 DIAGNOSIS — R29898 Other symptoms and signs involving the musculoskeletal system: Secondary | ICD-10-CM | POA: Diagnosis not present

## 2022-12-24 DIAGNOSIS — M25651 Stiffness of right hip, not elsewhere classified: Secondary | ICD-10-CM | POA: Diagnosis not present

## 2022-12-24 DIAGNOSIS — M6281 Muscle weakness (generalized): Secondary | ICD-10-CM | POA: Diagnosis not present

## 2023-01-08 DIAGNOSIS — Z96643 Presence of artificial hip joint, bilateral: Secondary | ICD-10-CM | POA: Diagnosis not present

## 2023-01-08 DIAGNOSIS — Z471 Aftercare following joint replacement surgery: Secondary | ICD-10-CM | POA: Diagnosis not present

## 2023-01-08 DIAGNOSIS — G8918 Other acute postprocedural pain: Secondary | ICD-10-CM | POA: Diagnosis not present

## 2023-01-08 DIAGNOSIS — M1611 Unilateral primary osteoarthritis, right hip: Secondary | ICD-10-CM | POA: Diagnosis not present

## 2023-01-15 DIAGNOSIS — R29898 Other symptoms and signs involving the musculoskeletal system: Secondary | ICD-10-CM | POA: Diagnosis not present

## 2023-01-15 DIAGNOSIS — M6281 Muscle weakness (generalized): Secondary | ICD-10-CM | POA: Diagnosis not present

## 2023-01-15 DIAGNOSIS — M1611 Unilateral primary osteoarthritis, right hip: Secondary | ICD-10-CM | POA: Diagnosis not present

## 2023-01-15 DIAGNOSIS — M25651 Stiffness of right hip, not elsewhere classified: Secondary | ICD-10-CM | POA: Diagnosis not present

## 2023-01-22 DIAGNOSIS — M25651 Stiffness of right hip, not elsewhere classified: Secondary | ICD-10-CM | POA: Diagnosis not present

## 2023-01-22 DIAGNOSIS — M1611 Unilateral primary osteoarthritis, right hip: Secondary | ICD-10-CM | POA: Diagnosis not present

## 2023-01-22 DIAGNOSIS — R29898 Other symptoms and signs involving the musculoskeletal system: Secondary | ICD-10-CM | POA: Diagnosis not present

## 2023-01-22 DIAGNOSIS — M6281 Muscle weakness (generalized): Secondary | ICD-10-CM | POA: Diagnosis not present

## 2023-01-26 DIAGNOSIS — Z96641 Presence of right artificial hip joint: Secondary | ICD-10-CM | POA: Diagnosis not present

## 2023-01-27 DIAGNOSIS — M25651 Stiffness of right hip, not elsewhere classified: Secondary | ICD-10-CM | POA: Diagnosis not present

## 2023-01-27 DIAGNOSIS — M1611 Unilateral primary osteoarthritis, right hip: Secondary | ICD-10-CM | POA: Diagnosis not present

## 2023-01-27 DIAGNOSIS — M6281 Muscle weakness (generalized): Secondary | ICD-10-CM | POA: Diagnosis not present

## 2023-01-27 DIAGNOSIS — R29898 Other symptoms and signs involving the musculoskeletal system: Secondary | ICD-10-CM | POA: Diagnosis not present

## 2023-01-29 DIAGNOSIS — M1611 Unilateral primary osteoarthritis, right hip: Secondary | ICD-10-CM | POA: Diagnosis not present

## 2023-01-29 DIAGNOSIS — M6281 Muscle weakness (generalized): Secondary | ICD-10-CM | POA: Diagnosis not present

## 2023-01-29 DIAGNOSIS — M25651 Stiffness of right hip, not elsewhere classified: Secondary | ICD-10-CM | POA: Diagnosis not present

## 2023-01-29 DIAGNOSIS — R29898 Other symptoms and signs involving the musculoskeletal system: Secondary | ICD-10-CM | POA: Diagnosis not present

## 2023-02-03 DIAGNOSIS — M1611 Unilateral primary osteoarthritis, right hip: Secondary | ICD-10-CM | POA: Diagnosis not present

## 2023-02-03 DIAGNOSIS — M25651 Stiffness of right hip, not elsewhere classified: Secondary | ICD-10-CM | POA: Diagnosis not present

## 2023-02-03 DIAGNOSIS — R29898 Other symptoms and signs involving the musculoskeletal system: Secondary | ICD-10-CM | POA: Diagnosis not present

## 2023-02-03 DIAGNOSIS — M6281 Muscle weakness (generalized): Secondary | ICD-10-CM | POA: Diagnosis not present

## 2023-02-05 DIAGNOSIS — M6281 Muscle weakness (generalized): Secondary | ICD-10-CM | POA: Diagnosis not present

## 2023-02-05 DIAGNOSIS — R29898 Other symptoms and signs involving the musculoskeletal system: Secondary | ICD-10-CM | POA: Diagnosis not present

## 2023-02-05 DIAGNOSIS — M25651 Stiffness of right hip, not elsewhere classified: Secondary | ICD-10-CM | POA: Diagnosis not present

## 2023-02-05 DIAGNOSIS — M1611 Unilateral primary osteoarthritis, right hip: Secondary | ICD-10-CM | POA: Diagnosis not present

## 2023-02-10 DIAGNOSIS — M25651 Stiffness of right hip, not elsewhere classified: Secondary | ICD-10-CM | POA: Diagnosis not present

## 2023-02-10 DIAGNOSIS — R29898 Other symptoms and signs involving the musculoskeletal system: Secondary | ICD-10-CM | POA: Diagnosis not present

## 2023-02-10 DIAGNOSIS — M6281 Muscle weakness (generalized): Secondary | ICD-10-CM | POA: Diagnosis not present

## 2023-02-10 DIAGNOSIS — M1611 Unilateral primary osteoarthritis, right hip: Secondary | ICD-10-CM | POA: Diagnosis not present

## 2023-02-12 DIAGNOSIS — M6281 Muscle weakness (generalized): Secondary | ICD-10-CM | POA: Diagnosis not present

## 2023-02-12 DIAGNOSIS — R29898 Other symptoms and signs involving the musculoskeletal system: Secondary | ICD-10-CM | POA: Diagnosis not present

## 2023-02-12 DIAGNOSIS — M1611 Unilateral primary osteoarthritis, right hip: Secondary | ICD-10-CM | POA: Diagnosis not present

## 2023-02-12 DIAGNOSIS — M25651 Stiffness of right hip, not elsewhere classified: Secondary | ICD-10-CM | POA: Diagnosis not present

## 2023-02-17 DIAGNOSIS — R29898 Other symptoms and signs involving the musculoskeletal system: Secondary | ICD-10-CM | POA: Diagnosis not present

## 2023-02-17 DIAGNOSIS — M1611 Unilateral primary osteoarthritis, right hip: Secondary | ICD-10-CM | POA: Diagnosis not present

## 2023-02-17 DIAGNOSIS — M25651 Stiffness of right hip, not elsewhere classified: Secondary | ICD-10-CM | POA: Diagnosis not present

## 2023-02-17 DIAGNOSIS — M6281 Muscle weakness (generalized): Secondary | ICD-10-CM | POA: Diagnosis not present

## 2023-02-19 DIAGNOSIS — M6281 Muscle weakness (generalized): Secondary | ICD-10-CM | POA: Diagnosis not present

## 2023-02-19 DIAGNOSIS — R29898 Other symptoms and signs involving the musculoskeletal system: Secondary | ICD-10-CM | POA: Diagnosis not present

## 2023-02-19 DIAGNOSIS — M25651 Stiffness of right hip, not elsewhere classified: Secondary | ICD-10-CM | POA: Diagnosis not present

## 2023-02-19 DIAGNOSIS — M1611 Unilateral primary osteoarthritis, right hip: Secondary | ICD-10-CM | POA: Diagnosis not present

## 2023-02-24 DIAGNOSIS — M6281 Muscle weakness (generalized): Secondary | ICD-10-CM | POA: Diagnosis not present

## 2023-02-24 DIAGNOSIS — M1611 Unilateral primary osteoarthritis, right hip: Secondary | ICD-10-CM | POA: Diagnosis not present

## 2023-02-24 DIAGNOSIS — R29898 Other symptoms and signs involving the musculoskeletal system: Secondary | ICD-10-CM | POA: Diagnosis not present

## 2023-02-24 DIAGNOSIS — M25651 Stiffness of right hip, not elsewhere classified: Secondary | ICD-10-CM | POA: Diagnosis not present

## 2023-02-26 DIAGNOSIS — M25651 Stiffness of right hip, not elsewhere classified: Secondary | ICD-10-CM | POA: Diagnosis not present

## 2023-02-26 DIAGNOSIS — M6281 Muscle weakness (generalized): Secondary | ICD-10-CM | POA: Diagnosis not present

## 2023-02-26 DIAGNOSIS — R29898 Other symptoms and signs involving the musculoskeletal system: Secondary | ICD-10-CM | POA: Diagnosis not present

## 2023-02-26 DIAGNOSIS — M1611 Unilateral primary osteoarthritis, right hip: Secondary | ICD-10-CM | POA: Diagnosis not present

## 2023-03-02 DIAGNOSIS — M6281 Muscle weakness (generalized): Secondary | ICD-10-CM | POA: Diagnosis not present

## 2023-03-02 DIAGNOSIS — M1611 Unilateral primary osteoarthritis, right hip: Secondary | ICD-10-CM | POA: Diagnosis not present

## 2023-03-02 DIAGNOSIS — R29898 Other symptoms and signs involving the musculoskeletal system: Secondary | ICD-10-CM | POA: Diagnosis not present

## 2023-03-02 DIAGNOSIS — M25651 Stiffness of right hip, not elsewhere classified: Secondary | ICD-10-CM | POA: Diagnosis not present

## 2023-03-03 DIAGNOSIS — Z471 Aftercare following joint replacement surgery: Secondary | ICD-10-CM | POA: Diagnosis not present

## 2023-03-03 DIAGNOSIS — Z96641 Presence of right artificial hip joint: Secondary | ICD-10-CM | POA: Diagnosis not present

## 2023-03-05 DIAGNOSIS — M25651 Stiffness of right hip, not elsewhere classified: Secondary | ICD-10-CM | POA: Diagnosis not present

## 2023-03-05 DIAGNOSIS — M6281 Muscle weakness (generalized): Secondary | ICD-10-CM | POA: Diagnosis not present

## 2023-03-05 DIAGNOSIS — M1611 Unilateral primary osteoarthritis, right hip: Secondary | ICD-10-CM | POA: Diagnosis not present

## 2023-03-05 DIAGNOSIS — R29898 Other symptoms and signs involving the musculoskeletal system: Secondary | ICD-10-CM | POA: Diagnosis not present

## 2023-03-10 DIAGNOSIS — M6281 Muscle weakness (generalized): Secondary | ICD-10-CM | POA: Diagnosis not present

## 2023-03-10 DIAGNOSIS — R29898 Other symptoms and signs involving the musculoskeletal system: Secondary | ICD-10-CM | POA: Diagnosis not present

## 2023-03-10 DIAGNOSIS — M25651 Stiffness of right hip, not elsewhere classified: Secondary | ICD-10-CM | POA: Diagnosis not present

## 2023-03-10 DIAGNOSIS — M1611 Unilateral primary osteoarthritis, right hip: Secondary | ICD-10-CM | POA: Diagnosis not present

## 2023-03-12 DIAGNOSIS — R29898 Other symptoms and signs involving the musculoskeletal system: Secondary | ICD-10-CM | POA: Diagnosis not present

## 2023-03-12 DIAGNOSIS — M25651 Stiffness of right hip, not elsewhere classified: Secondary | ICD-10-CM | POA: Diagnosis not present

## 2023-03-12 DIAGNOSIS — M6281 Muscle weakness (generalized): Secondary | ICD-10-CM | POA: Diagnosis not present

## 2023-03-12 DIAGNOSIS — M1611 Unilateral primary osteoarthritis, right hip: Secondary | ICD-10-CM | POA: Diagnosis not present

## 2023-03-17 DIAGNOSIS — M25651 Stiffness of right hip, not elsewhere classified: Secondary | ICD-10-CM | POA: Diagnosis not present

## 2023-03-17 DIAGNOSIS — M1611 Unilateral primary osteoarthritis, right hip: Secondary | ICD-10-CM | POA: Diagnosis not present

## 2023-03-17 DIAGNOSIS — M6281 Muscle weakness (generalized): Secondary | ICD-10-CM | POA: Diagnosis not present

## 2023-03-17 DIAGNOSIS — R29898 Other symptoms and signs involving the musculoskeletal system: Secondary | ICD-10-CM | POA: Diagnosis not present

## 2023-03-19 DIAGNOSIS — M25651 Stiffness of right hip, not elsewhere classified: Secondary | ICD-10-CM | POA: Diagnosis not present

## 2023-03-19 DIAGNOSIS — M1611 Unilateral primary osteoarthritis, right hip: Secondary | ICD-10-CM | POA: Diagnosis not present

## 2023-03-19 DIAGNOSIS — R29898 Other symptoms and signs involving the musculoskeletal system: Secondary | ICD-10-CM | POA: Diagnosis not present

## 2023-03-19 DIAGNOSIS — M6281 Muscle weakness (generalized): Secondary | ICD-10-CM | POA: Diagnosis not present

## 2023-03-24 DIAGNOSIS — M25651 Stiffness of right hip, not elsewhere classified: Secondary | ICD-10-CM | POA: Diagnosis not present

## 2023-03-24 DIAGNOSIS — R29898 Other symptoms and signs involving the musculoskeletal system: Secondary | ICD-10-CM | POA: Diagnosis not present

## 2023-03-24 DIAGNOSIS — M6281 Muscle weakness (generalized): Secondary | ICD-10-CM | POA: Diagnosis not present

## 2023-03-24 DIAGNOSIS — M1611 Unilateral primary osteoarthritis, right hip: Secondary | ICD-10-CM | POA: Diagnosis not present

## 2023-03-26 DIAGNOSIS — Z7409 Other reduced mobility: Secondary | ICD-10-CM | POA: Diagnosis not present

## 2023-03-26 DIAGNOSIS — M6281 Muscle weakness (generalized): Secondary | ICD-10-CM | POA: Diagnosis not present

## 2023-03-26 DIAGNOSIS — M1611 Unilateral primary osteoarthritis, right hip: Secondary | ICD-10-CM | POA: Diagnosis not present

## 2023-03-31 DIAGNOSIS — M6281 Muscle weakness (generalized): Secondary | ICD-10-CM | POA: Diagnosis not present

## 2023-03-31 DIAGNOSIS — M1611 Unilateral primary osteoarthritis, right hip: Secondary | ICD-10-CM | POA: Diagnosis not present

## 2023-03-31 DIAGNOSIS — Z7409 Other reduced mobility: Secondary | ICD-10-CM | POA: Diagnosis not present

## 2023-03-31 DIAGNOSIS — R29898 Other symptoms and signs involving the musculoskeletal system: Secondary | ICD-10-CM | POA: Diagnosis not present

## 2023-04-02 DIAGNOSIS — M6281 Muscle weakness (generalized): Secondary | ICD-10-CM | POA: Diagnosis not present

## 2023-04-02 DIAGNOSIS — Z7409 Other reduced mobility: Secondary | ICD-10-CM | POA: Diagnosis not present

## 2023-04-02 DIAGNOSIS — M1611 Unilateral primary osteoarthritis, right hip: Secondary | ICD-10-CM | POA: Diagnosis not present

## 2023-04-02 DIAGNOSIS — R29898 Other symptoms and signs involving the musculoskeletal system: Secondary | ICD-10-CM | POA: Diagnosis not present

## 2023-05-11 DIAGNOSIS — Z96641 Presence of right artificial hip joint: Secondary | ICD-10-CM | POA: Diagnosis not present

## 2023-05-11 DIAGNOSIS — M25551 Pain in right hip: Secondary | ICD-10-CM | POA: Diagnosis not present

## 2023-05-11 DIAGNOSIS — Z96643 Presence of artificial hip joint, bilateral: Secondary | ICD-10-CM | POA: Diagnosis not present

## 2023-06-11 DIAGNOSIS — R351 Nocturia: Secondary | ICD-10-CM | POA: Diagnosis not present

## 2023-06-11 DIAGNOSIS — E782 Mixed hyperlipidemia: Secondary | ICD-10-CM | POA: Diagnosis not present

## 2023-06-11 DIAGNOSIS — R519 Headache, unspecified: Secondary | ICD-10-CM | POA: Diagnosis not present

## 2023-06-11 DIAGNOSIS — R7303 Prediabetes: Secondary | ICD-10-CM | POA: Diagnosis not present

## 2023-06-17 DIAGNOSIS — R0683 Snoring: Secondary | ICD-10-CM | POA: Diagnosis not present

## 2023-06-17 DIAGNOSIS — G478 Other sleep disorders: Secondary | ICD-10-CM | POA: Diagnosis not present

## 2023-06-17 DIAGNOSIS — R7303 Prediabetes: Secondary | ICD-10-CM | POA: Diagnosis not present

## 2023-06-17 DIAGNOSIS — G4719 Other hypersomnia: Secondary | ICD-10-CM | POA: Diagnosis not present

## 2023-07-10 DIAGNOSIS — G4733 Obstructive sleep apnea (adult) (pediatric): Secondary | ICD-10-CM | POA: Diagnosis not present

## 2023-07-15 DIAGNOSIS — G4733 Obstructive sleep apnea (adult) (pediatric): Secondary | ICD-10-CM | POA: Diagnosis not present

## 2023-08-03 DIAGNOSIS — R7303 Prediabetes: Secondary | ICD-10-CM | POA: Diagnosis not present

## 2023-08-03 DIAGNOSIS — Z6829 Body mass index (BMI) 29.0-29.9, adult: Secondary | ICD-10-CM | POA: Diagnosis not present

## 2023-08-03 DIAGNOSIS — G4733 Obstructive sleep apnea (adult) (pediatric): Secondary | ICD-10-CM | POA: Diagnosis not present

## 2023-08-03 DIAGNOSIS — E782 Mixed hyperlipidemia: Secondary | ICD-10-CM | POA: Diagnosis not present

## 2023-09-03 DIAGNOSIS — G4733 Obstructive sleep apnea (adult) (pediatric): Secondary | ICD-10-CM | POA: Diagnosis not present

## 2023-09-07 DIAGNOSIS — Z0000 Encounter for general adult medical examination without abnormal findings: Secondary | ICD-10-CM | POA: Diagnosis not present

## 2023-09-07 DIAGNOSIS — Z6828 Body mass index (BMI) 28.0-28.9, adult: Secondary | ICD-10-CM | POA: Diagnosis not present

## 2023-09-07 DIAGNOSIS — R7303 Prediabetes: Secondary | ICD-10-CM | POA: Diagnosis not present

## 2023-09-07 DIAGNOSIS — G4733 Obstructive sleep apnea (adult) (pediatric): Secondary | ICD-10-CM | POA: Diagnosis not present

## 2023-09-07 DIAGNOSIS — E782 Mixed hyperlipidemia: Secondary | ICD-10-CM | POA: Diagnosis not present

## 2023-10-04 DIAGNOSIS — G4733 Obstructive sleep apnea (adult) (pediatric): Secondary | ICD-10-CM | POA: Diagnosis not present

## 2023-10-21 DIAGNOSIS — R7303 Prediabetes: Secondary | ICD-10-CM | POA: Diagnosis not present

## 2023-10-21 DIAGNOSIS — G4733 Obstructive sleep apnea (adult) (pediatric): Secondary | ICD-10-CM | POA: Diagnosis not present

## 2023-10-21 DIAGNOSIS — E782 Mixed hyperlipidemia: Secondary | ICD-10-CM | POA: Diagnosis not present

## 2023-10-29 IMAGING — US US ABDOMEN COMPLETE
1 series · 14 of 25 positions shown · non-contrast
Comparison: None.

CLINICAL DATA: Elevated LFTs.

EXAM:
ABDOMEN ULTRASOUND COMPLETE

[Series 1: us abdomen complete · 0.19mm/px · 14 of 71 slices shown]
[im 1/71]
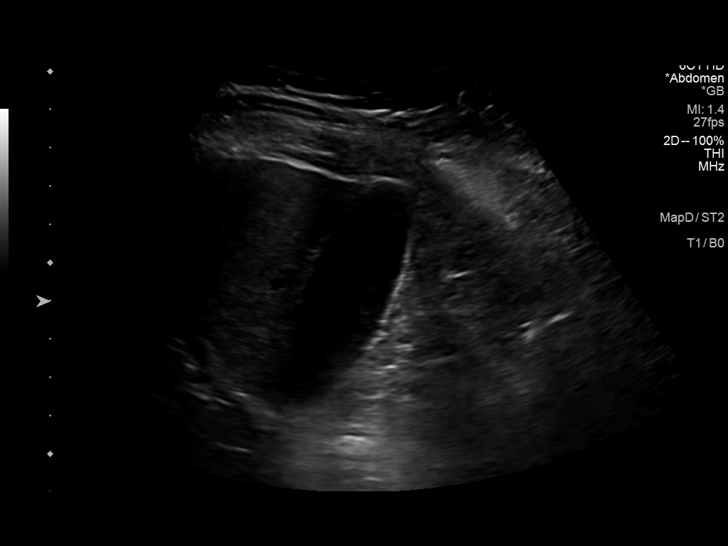
[im 6/71]
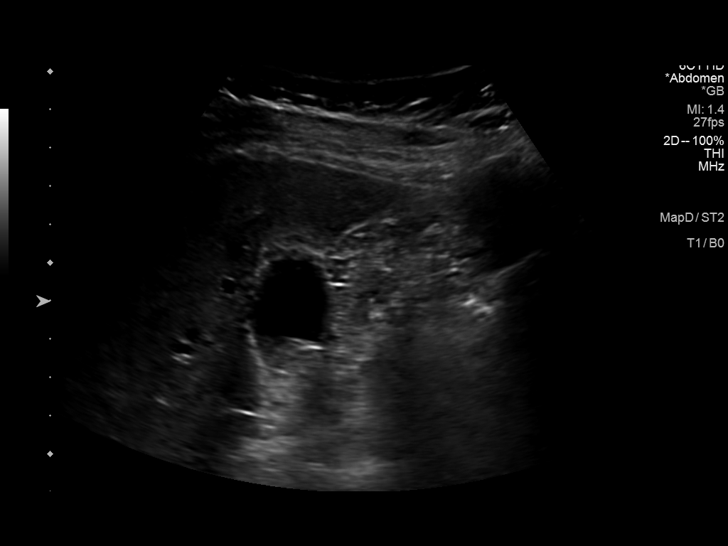
[im 12/71]
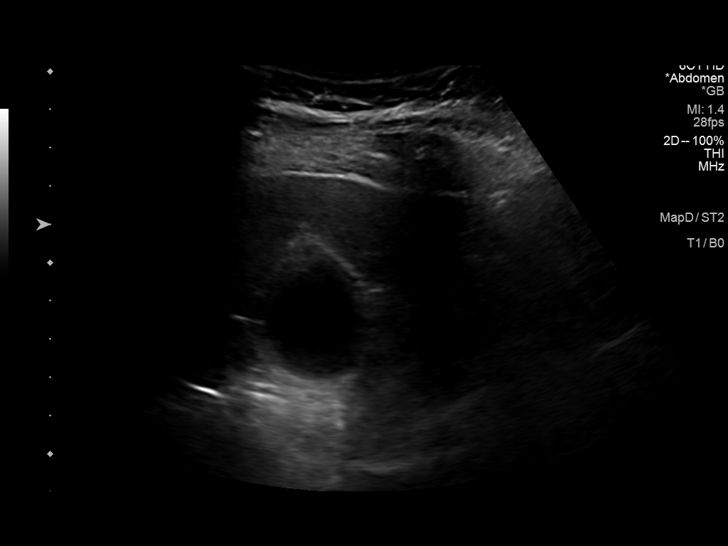
[im 18/71]
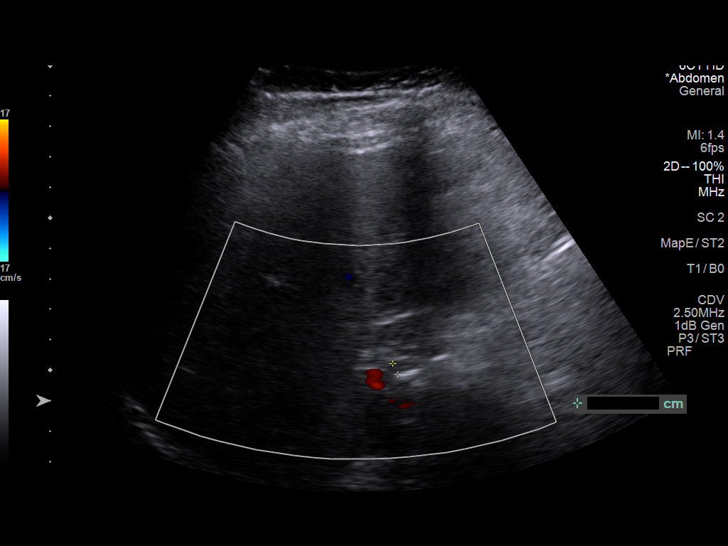
[im 24/71]
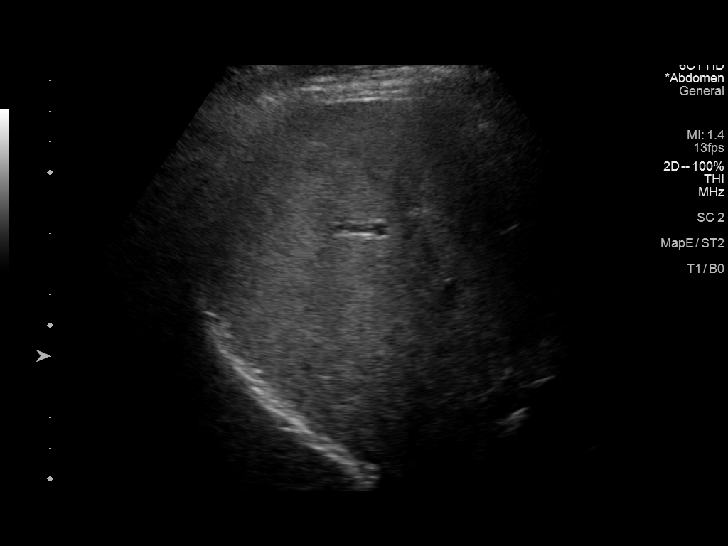
[im 27/71]
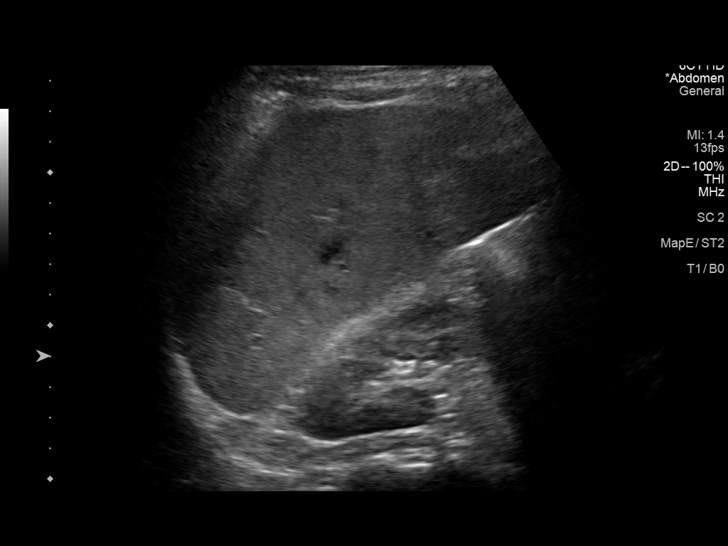
[im 33/71]
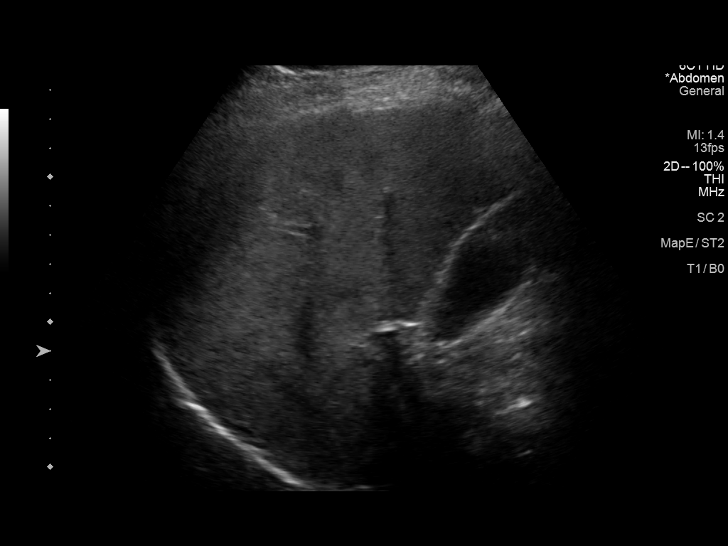
[im 38/71]
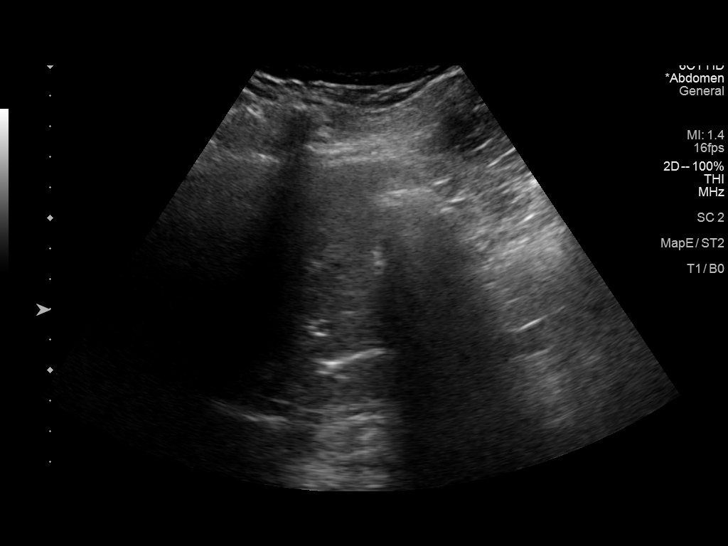
[im 44/71]
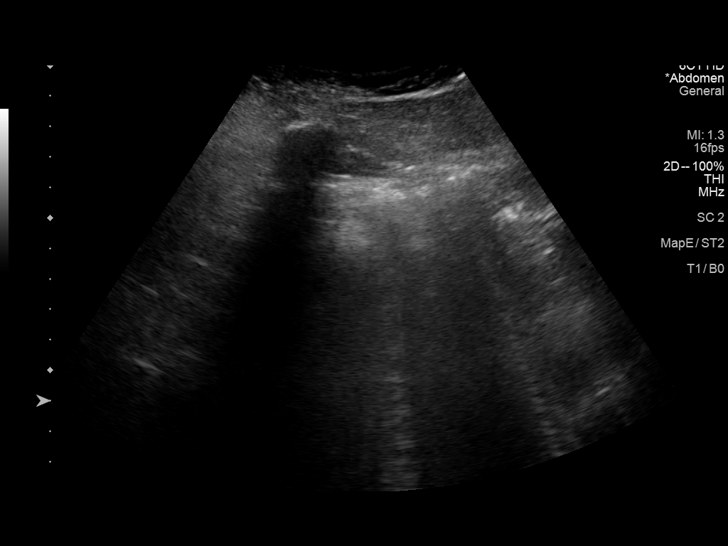
[im 47/71]
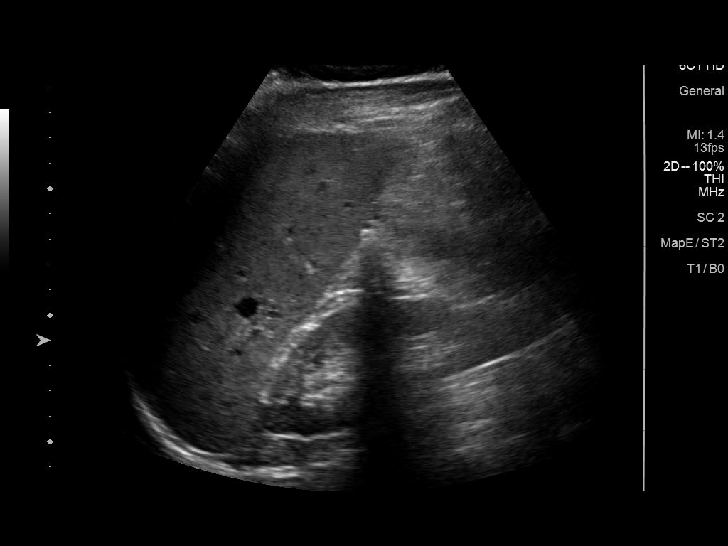
[im 53/71]
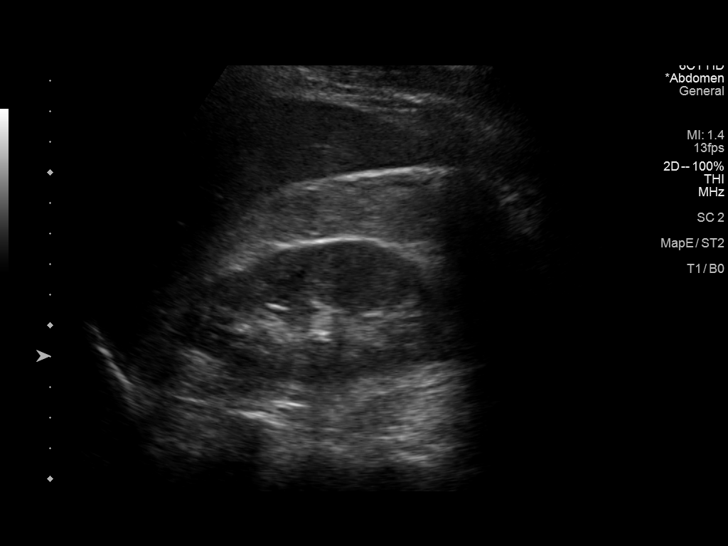
[im 59/71]
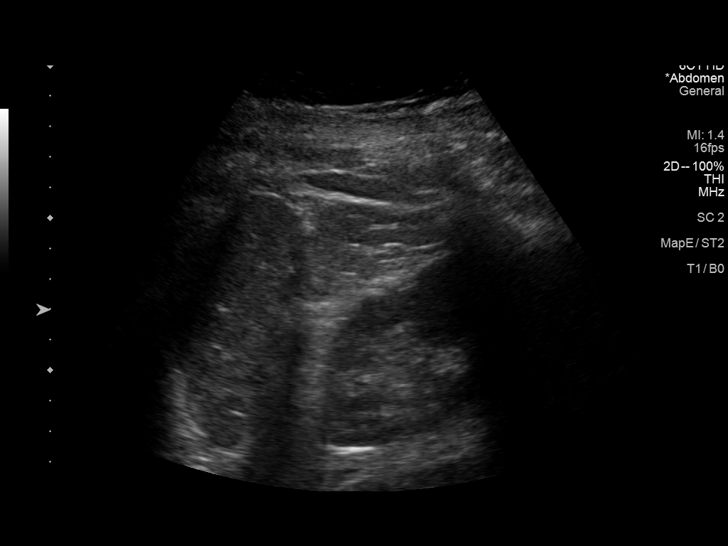
[im 65/71]
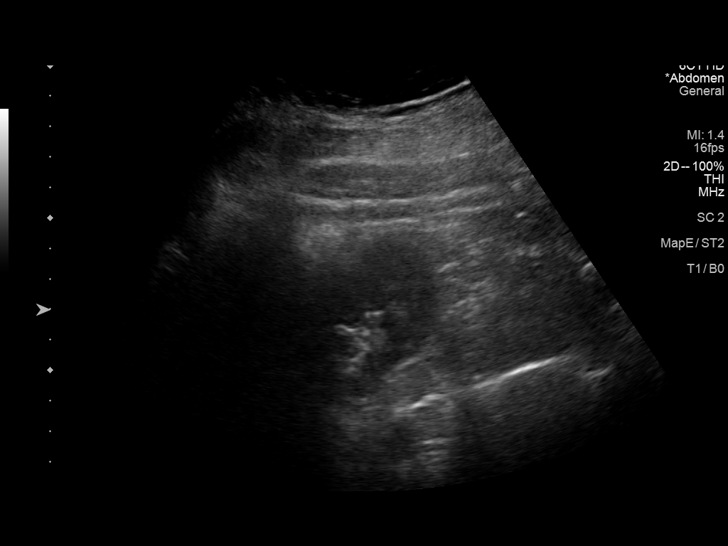
[im 71/71]
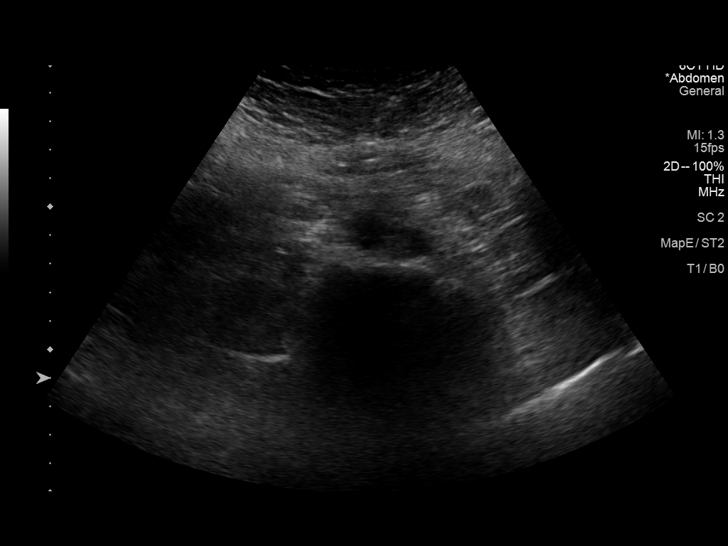

[14 of 25 positions shown; findings below may reference images not displayed]

FINDINGS: Gallbladder: No gallstones or wall thickening visualized. No
sonographic Murphy sign noted by sonographer.

Common bile duct: Diameter: 4 mm

Liver: No focal lesion identified. Coarsened hepatic echotexture
with increased hepatic echogenicity. No discrete contour
nodularity. Portal vein is patent on color Doppler imaging with
normal direction of blood flow towards the liver.

IVC: No abnormality visualized.

Pancreas: Visualized portion unremarkable.

Spleen: Size and appearance within normal limits.

Right Kidney: Length: 11 cm. Echogenicity within normal limits. No
mass or hydronephrosis visualized.

Left Kidney: Length: 11.2 cm. Echogenicity within normal limits. No
mass or hydronephrosis visualized.

Abdominal aorta: No aneurysm visualized.

Other findings: None.
IMPRESSION: Coarsened hepatic echotexture with increased hepatic echogenicity.
Findings are nonspecific but can be seen in the setting of fatty
infiltration or cirrhosis. No focal hepatic lesion identified.

## 2023-11-03 DIAGNOSIS — R400 Somnolence: Secondary | ICD-10-CM | POA: Diagnosis not present

## 2023-11-16 DIAGNOSIS — G4733 Obstructive sleep apnea (adult) (pediatric): Secondary | ICD-10-CM | POA: Diagnosis not present

## 2023-12-04 DIAGNOSIS — R400 Somnolence: Secondary | ICD-10-CM | POA: Diagnosis not present

## 2023-12-04 DIAGNOSIS — G4733 Obstructive sleep apnea (adult) (pediatric): Secondary | ICD-10-CM | POA: Diagnosis not present

## 2023-12-16 DIAGNOSIS — G4733 Obstructive sleep apnea (adult) (pediatric): Secondary | ICD-10-CM | POA: Diagnosis not present

## 2024-01-03 DIAGNOSIS — G4733 Obstructive sleep apnea (adult) (pediatric): Secondary | ICD-10-CM | POA: Diagnosis not present

## 2024-01-03 DIAGNOSIS — R400 Somnolence: Secondary | ICD-10-CM | POA: Diagnosis not present

## 2024-01-27 DIAGNOSIS — Z96641 Presence of right artificial hip joint: Secondary | ICD-10-CM | POA: Diagnosis not present

## 2024-01-27 DIAGNOSIS — Z96643 Presence of artificial hip joint, bilateral: Secondary | ICD-10-CM | POA: Diagnosis not present
